# Patient Record
Sex: Female | Born: 1945 | Race: Black or African American | Hispanic: No | Smoking: Never smoker
Health system: Southern US, Community
[De-identification: ages and names within clinical notes are randomized; demographics above are authoritative.]

## PROBLEM LIST (undated history)

## (undated) DIAGNOSIS — E7800 Pure hypercholesterolemia, unspecified: Secondary | ICD-10-CM

## (undated) DIAGNOSIS — I10 Essential (primary) hypertension: Secondary | ICD-10-CM

## (undated) DIAGNOSIS — E119 Type 2 diabetes mellitus without complications: Secondary | ICD-10-CM

## (undated) HISTORY — PX: CARPAL TUNNEL RELEASE: SHX101

---

## 1997-11-19 ENCOUNTER — Encounter: Admission: RE | Admit: 1997-11-19 | Discharge: 1997-11-19 | Payer: Self-pay | Admitting: Family Medicine

## 1997-12-19 ENCOUNTER — Encounter: Admission: RE | Admit: 1997-12-19 | Discharge: 1997-12-19 | Payer: Self-pay | Admitting: Family Medicine

## 1998-04-16 ENCOUNTER — Encounter: Admission: RE | Admit: 1998-04-16 | Discharge: 1998-04-16 | Payer: Self-pay | Admitting: Family Medicine

## 1998-05-09 ENCOUNTER — Encounter: Admission: RE | Admit: 1998-05-09 | Discharge: 1998-05-09 | Payer: Self-pay | Admitting: Family Medicine

## 1998-06-04 ENCOUNTER — Encounter: Admission: RE | Admit: 1998-06-04 | Discharge: 1998-06-04 | Payer: Self-pay | Admitting: Family Medicine

## 1998-07-02 ENCOUNTER — Encounter: Admission: RE | Admit: 1998-07-02 | Discharge: 1998-07-02 | Payer: Self-pay | Admitting: Family Medicine

## 1998-07-30 ENCOUNTER — Encounter: Admission: RE | Admit: 1998-07-30 | Discharge: 1998-07-30 | Payer: Self-pay | Admitting: Family Medicine

## 1999-04-09 ENCOUNTER — Encounter: Admission: RE | Admit: 1999-04-09 | Discharge: 1999-04-09 | Payer: Self-pay | Admitting: Family Medicine

## 1999-05-15 ENCOUNTER — Encounter: Admission: RE | Admit: 1999-05-15 | Discharge: 1999-05-15 | Payer: Self-pay | Admitting: Family Medicine

## 1999-06-09 ENCOUNTER — Encounter: Payer: Self-pay | Admitting: Sports Medicine

## 1999-06-09 ENCOUNTER — Encounter: Admission: RE | Admit: 1999-06-09 | Discharge: 1999-06-09 | Payer: Self-pay | Admitting: Sports Medicine

## 1999-06-16 ENCOUNTER — Encounter: Admission: RE | Admit: 1999-06-16 | Discharge: 1999-06-16 | Payer: Self-pay | Admitting: Sports Medicine

## 1999-06-16 ENCOUNTER — Encounter: Payer: Self-pay | Admitting: Sports Medicine

## 1999-11-25 ENCOUNTER — Encounter: Admission: RE | Admit: 1999-11-25 | Discharge: 1999-11-25 | Payer: Self-pay | Admitting: Family Medicine

## 1999-12-09 ENCOUNTER — Encounter: Admission: RE | Admit: 1999-12-09 | Discharge: 1999-12-09 | Payer: Self-pay | Admitting: Family Medicine

## 1999-12-23 ENCOUNTER — Encounter: Admission: RE | Admit: 1999-12-23 | Discharge: 1999-12-23 | Payer: Self-pay | Admitting: Family Medicine

## 2000-01-14 ENCOUNTER — Encounter: Admission: RE | Admit: 2000-01-14 | Discharge: 2000-01-14 | Payer: Self-pay | Admitting: Family Medicine

## 2000-06-20 ENCOUNTER — Encounter: Admission: RE | Admit: 2000-06-20 | Discharge: 2000-06-20 | Payer: Self-pay | Admitting: *Deleted

## 2000-12-20 ENCOUNTER — Encounter: Admission: RE | Admit: 2000-12-20 | Discharge: 2000-12-20 | Payer: Self-pay | Admitting: Family Medicine

## 2001-01-26 ENCOUNTER — Encounter: Admission: RE | Admit: 2001-01-26 | Discharge: 2001-01-26 | Payer: Self-pay | Admitting: Family Medicine

## 2001-02-21 ENCOUNTER — Encounter: Admission: RE | Admit: 2001-02-21 | Discharge: 2001-02-21 | Payer: Self-pay | Admitting: Sports Medicine

## 2001-03-27 ENCOUNTER — Ambulatory Visit (HOSPITAL_COMMUNITY): Admission: RE | Admit: 2001-03-27 | Discharge: 2001-03-27 | Payer: Self-pay | Admitting: Gastroenterology

## 2001-03-28 ENCOUNTER — Encounter: Admission: RE | Admit: 2001-03-28 | Discharge: 2001-03-28 | Payer: Self-pay | Admitting: Family Medicine

## 2001-03-29 ENCOUNTER — Ambulatory Visit (HOSPITAL_COMMUNITY): Admission: RE | Admit: 2001-03-29 | Discharge: 2001-03-29 | Payer: Self-pay | Admitting: Family Medicine

## 2001-04-12 ENCOUNTER — Encounter: Admission: RE | Admit: 2001-04-12 | Discharge: 2001-04-12 | Payer: Self-pay | Admitting: Family Medicine

## 2001-04-20 ENCOUNTER — Encounter: Admission: RE | Admit: 2001-04-20 | Discharge: 2001-04-20 | Payer: Self-pay | Admitting: *Deleted

## 2001-04-28 ENCOUNTER — Encounter: Admission: RE | Admit: 2001-04-28 | Discharge: 2001-04-28 | Payer: Self-pay | Admitting: Family Medicine

## 2001-05-05 ENCOUNTER — Encounter: Admission: RE | Admit: 2001-05-05 | Discharge: 2001-05-05 | Payer: Self-pay | Admitting: Family Medicine

## 2001-06-06 ENCOUNTER — Encounter: Admission: RE | Admit: 2001-06-06 | Discharge: 2001-06-06 | Payer: Self-pay | Admitting: Sports Medicine

## 2001-06-28 ENCOUNTER — Encounter: Admission: RE | Admit: 2001-06-28 | Discharge: 2001-06-28 | Payer: Self-pay | Admitting: *Deleted

## 2001-10-05 ENCOUNTER — Encounter: Admission: RE | Admit: 2001-10-05 | Discharge: 2001-10-05 | Payer: Self-pay | Admitting: Family Medicine

## 2002-02-13 ENCOUNTER — Encounter (INDEPENDENT_AMBULATORY_CARE_PROVIDER_SITE_OTHER): Payer: Self-pay | Admitting: *Deleted

## 2002-02-13 LAB — CONVERTED CEMR LAB

## 2002-02-14 ENCOUNTER — Encounter: Admission: RE | Admit: 2002-02-14 | Discharge: 2002-02-14 | Payer: Self-pay | Admitting: Family Medicine

## 2002-07-13 ENCOUNTER — Encounter: Admission: RE | Admit: 2002-07-13 | Discharge: 2002-07-13 | Payer: Self-pay | Admitting: Family Medicine

## 2002-07-13 ENCOUNTER — Encounter: Payer: Self-pay | Admitting: Family Medicine

## 2003-01-28 ENCOUNTER — Encounter: Admission: RE | Admit: 2003-01-28 | Discharge: 2003-01-28 | Payer: Self-pay | Admitting: Family Medicine

## 2003-03-11 ENCOUNTER — Encounter: Admission: RE | Admit: 2003-03-11 | Discharge: 2003-03-11 | Payer: Self-pay | Admitting: Family Medicine

## 2003-07-30 ENCOUNTER — Encounter: Admission: RE | Admit: 2003-07-30 | Discharge: 2003-07-30 | Payer: Self-pay | Admitting: Sports Medicine

## 2003-08-26 ENCOUNTER — Encounter: Admission: RE | Admit: 2003-08-26 | Discharge: 2003-08-26 | Payer: Self-pay | Admitting: Sports Medicine

## 2004-02-10 ENCOUNTER — Encounter: Admission: RE | Admit: 2004-02-10 | Discharge: 2004-02-10 | Payer: Self-pay | Admitting: Sports Medicine

## 2004-02-10 ENCOUNTER — Ambulatory Visit (HOSPITAL_COMMUNITY): Admission: RE | Admit: 2004-02-10 | Discharge: 2004-02-10 | Payer: Self-pay | Admitting: Sports Medicine

## 2004-09-02 ENCOUNTER — Encounter: Admission: RE | Admit: 2004-09-02 | Discharge: 2004-09-02 | Payer: Self-pay | Admitting: Sports Medicine

## 2004-11-17 ENCOUNTER — Ambulatory Visit: Payer: Self-pay | Admitting: Family Medicine

## 2004-11-17 ENCOUNTER — Encounter: Admission: RE | Admit: 2004-11-17 | Discharge: 2004-11-17 | Payer: Self-pay | Admitting: Family Medicine

## 2004-11-30 ENCOUNTER — Ambulatory Visit: Payer: Self-pay | Admitting: Family Medicine

## 2004-12-17 ENCOUNTER — Ambulatory Visit: Payer: Self-pay | Admitting: Family Medicine

## 2005-04-26 ENCOUNTER — Ambulatory Visit: Payer: Self-pay | Admitting: Family Medicine

## 2005-05-17 ENCOUNTER — Ambulatory Visit: Payer: Self-pay | Admitting: Family Medicine

## 2005-05-24 ENCOUNTER — Ambulatory Visit: Payer: Self-pay | Admitting: Family Medicine

## 2005-12-14 ENCOUNTER — Encounter: Admission: RE | Admit: 2005-12-14 | Discharge: 2005-12-14 | Payer: Self-pay | Admitting: Internal Medicine

## 2005-12-20 ENCOUNTER — Other Ambulatory Visit: Admission: RE | Admit: 2005-12-20 | Discharge: 2005-12-20 | Payer: Self-pay | Admitting: Internal Medicine

## 2006-01-27 ENCOUNTER — Ambulatory Visit (HOSPITAL_BASED_OUTPATIENT_CLINIC_OR_DEPARTMENT_OTHER): Admission: RE | Admit: 2006-01-27 | Discharge: 2006-01-27 | Payer: Self-pay | Admitting: Orthopedic Surgery

## 2006-02-25 ENCOUNTER — Ambulatory Visit (HOSPITAL_BASED_OUTPATIENT_CLINIC_OR_DEPARTMENT_OTHER): Admission: RE | Admit: 2006-02-25 | Discharge: 2006-02-25 | Payer: Self-pay | Admitting: Orthopedic Surgery

## 2006-10-13 DIAGNOSIS — D649 Anemia, unspecified: Secondary | ICD-10-CM

## 2006-10-13 DIAGNOSIS — E669 Obesity, unspecified: Secondary | ICD-10-CM | POA: Insufficient documentation

## 2006-10-13 DIAGNOSIS — I10 Essential (primary) hypertension: Secondary | ICD-10-CM | POA: Insufficient documentation

## 2006-10-13 DIAGNOSIS — J309 Allergic rhinitis, unspecified: Secondary | ICD-10-CM | POA: Insufficient documentation

## 2006-10-14 ENCOUNTER — Encounter (INDEPENDENT_AMBULATORY_CARE_PROVIDER_SITE_OTHER): Payer: Self-pay | Admitting: *Deleted

## 2006-12-16 ENCOUNTER — Encounter: Admission: RE | Admit: 2006-12-16 | Discharge: 2006-12-16 | Payer: Self-pay | Admitting: Internal Medicine

## 2007-12-18 ENCOUNTER — Encounter: Admission: RE | Admit: 2007-12-18 | Discharge: 2007-12-18 | Payer: Self-pay | Admitting: Internal Medicine

## 2008-12-23 ENCOUNTER — Encounter: Admission: RE | Admit: 2008-12-23 | Discharge: 2008-12-23 | Payer: Self-pay | Admitting: Internal Medicine

## 2009-02-14 ENCOUNTER — Other Ambulatory Visit: Admission: RE | Admit: 2009-02-14 | Discharge: 2009-02-14 | Payer: Self-pay | Admitting: Internal Medicine

## 2010-01-01 ENCOUNTER — Encounter: Admission: RE | Admit: 2010-01-01 | Discharge: 2010-01-01 | Payer: Self-pay | Admitting: Internal Medicine

## 2010-07-03 IMAGING — MG MM SCREEN MAMMOGRAM BILATERAL
6 series · 6 of 6 positions shown · non-contrast
Comparison: none

DG SCREEN MAMMOGRAM BILATERAL
Bilateral CC and MLO view(s) were taken.

DIGITAL SCREENING MAMMOGRAM WITH CAD:
There are scattered fibroglandular densities.  No masses or malignant type calcifications are 
identified.  Compared with prior studies.

[R CC]
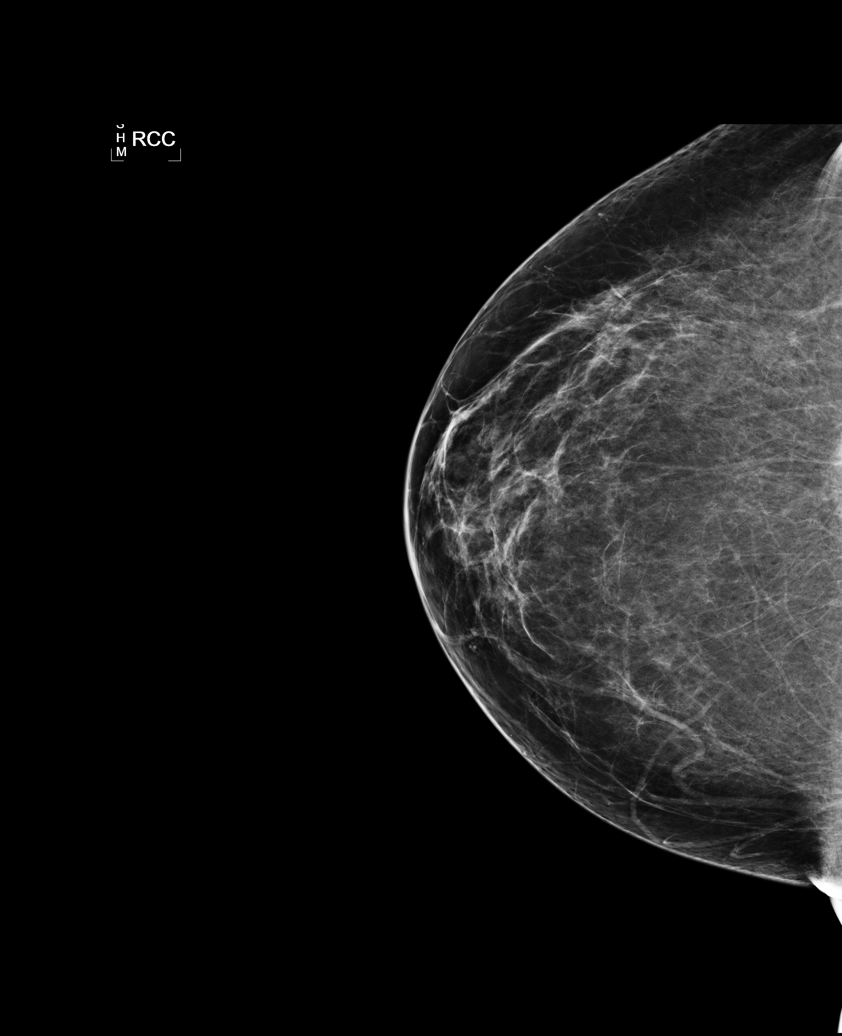

[L CC]
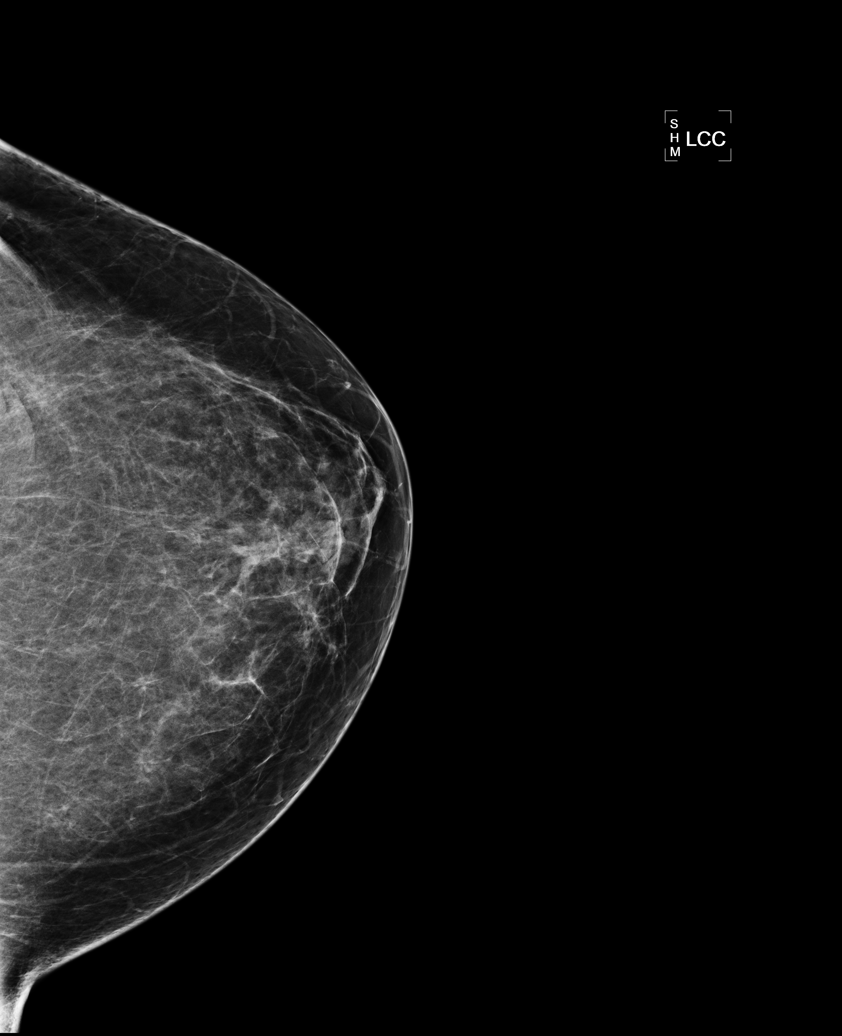

[L MLO (1 of 2)]
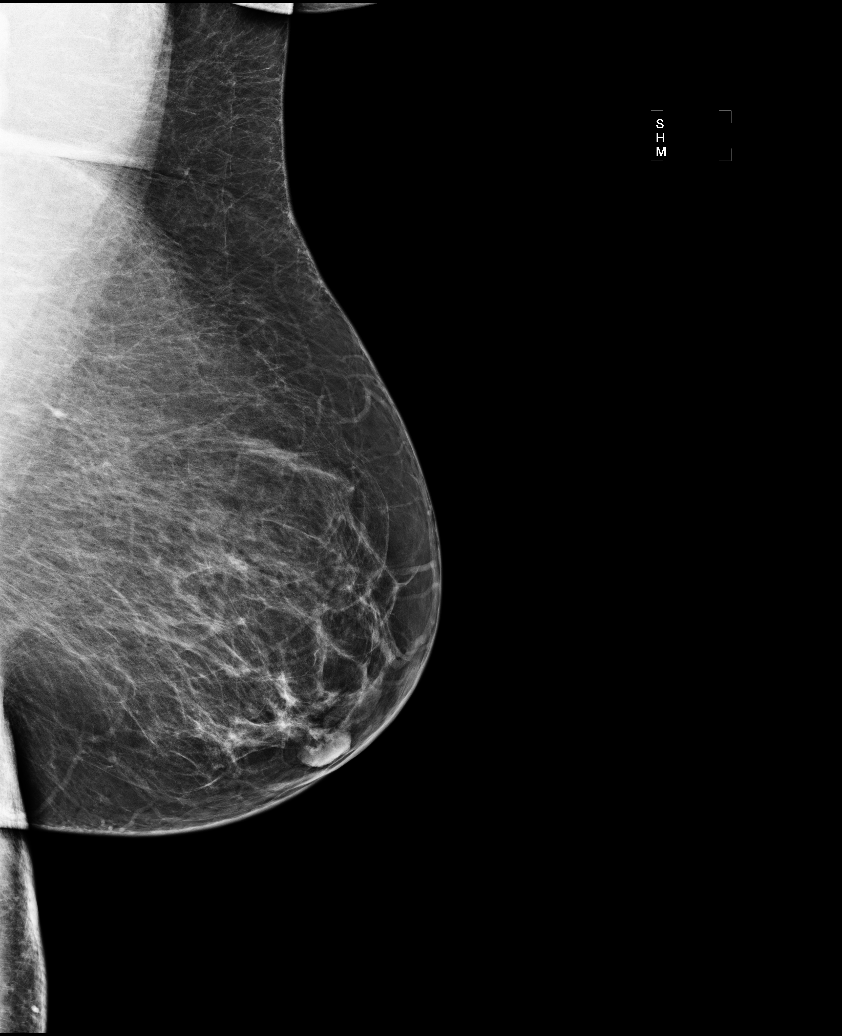

[R MLO (1 of 2)]
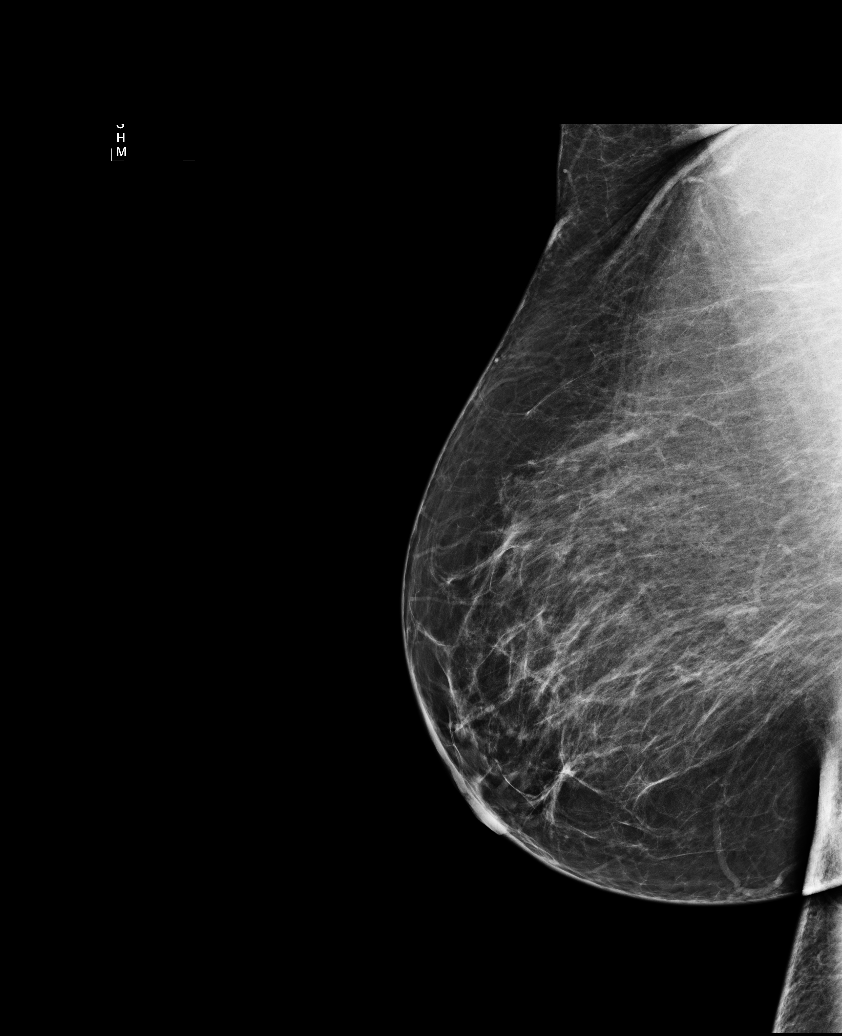

[R MLO (2 of 2)]
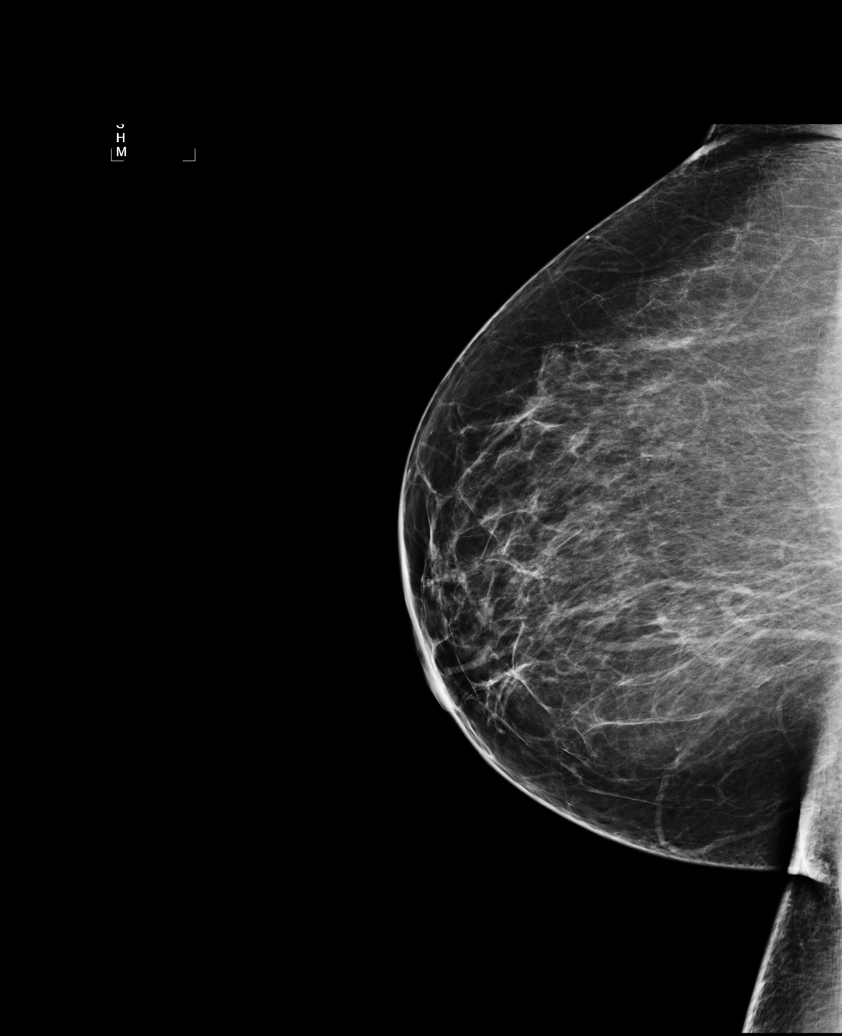

[L MLO (2 of 2)]
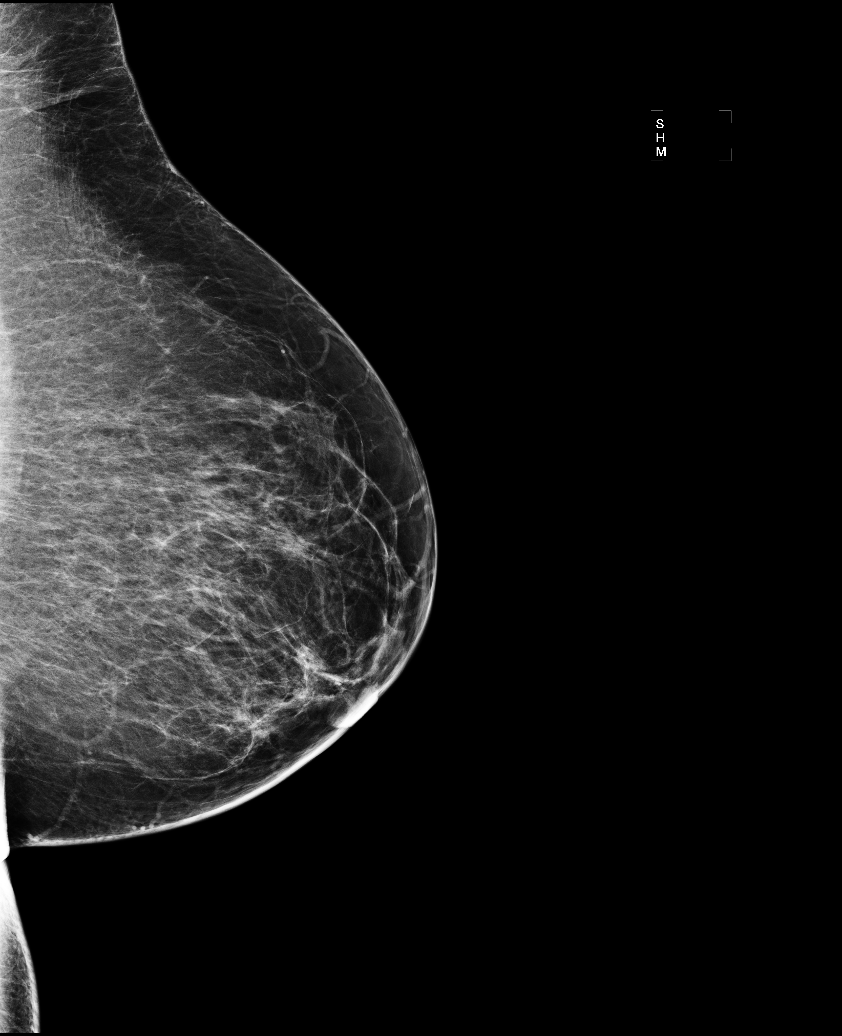

[6 of 6 positions shown; findings below may reference images not displayed]

IMPRESSION: No specific mammographic evidence of malignancy.  Next screening mammogram is recommended in one 
year.

A result letter of this screening mammogram will be mailed directly to the patient.

ASSESSMENT: Negative - BI-RADS 1

Screening mammogram in 1 year.
ANALYZED BY COMPUTER AIDED DETECTION. , THIS PROCEDURE WAS A DIGITAL MAMMOGRAM.

## 2010-09-06 ENCOUNTER — Encounter: Payer: Self-pay | Admitting: Internal Medicine

## 2010-12-07 ENCOUNTER — Other Ambulatory Visit: Payer: Self-pay | Admitting: Internal Medicine

## 2010-12-07 DIAGNOSIS — Z1231 Encounter for screening mammogram for malignant neoplasm of breast: Secondary | ICD-10-CM

## 2011-01-01 NOTE — Op Note (Signed)
[name], [name]                ACCOUNT NO.:  [id number]   MEDICAL RECORD NO.:  [id number]          PATIENT TYPE:  AMB   LOCATION:  DSC                          FACILITY:  [facility]   PHYSICIAN:  Katy Fitch. Sypher, M.D. 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   OPERATIONS:  Release of left transverse carpal ligament.   OPERATING SURGEON:  Katy Fitch. Sypher, M.D.   ASSISTANT:  Marveen Reeks. Dasnoit, P.A.-C.   ANESTHESIA:  General by LMA.   SUPERVISING ANESTHESIOLOGIST:  Janetta Hora. Gelene Mink, M.D.   INDICATIONS:  Sheena Parks is a 65 year old woman referred through the  courtesy of Dr. Donette Larry at [facility] for evaluation and management of  bilateral hand numbness.  Clinical examination revealed a pleasant 59-year-  old woman.  Inspection of her hands revealed loss of sensibility in the  median distribution left greater than right.  Electrodiagnostic studies  confirmed severe left carpal tunnel syndrome and moderate right carpal  tunnel syndrome.  We recommended proceeding with staged-release of her  transverse carpal ligament left followed by right.  After informed consent,  she is brought to the operating room at this time.   DESCRIPTION OF PROCEDURE:  Sheena Parks is brought to the operating room and  placed in supine position upon the operating table.  Following the induction  of general anesthesia by LMA technique.  The left arm was prepped with  Betadine soap solution, and sterilely draped.  Following exsanguination of  the left arm with an Esmarch bandage, an arterial tourniquet in the proximal  brachium is inflated to 220 mmHg.   The procedure commenced with a short incision in the line of the ring finger  in the palm.   Subcutaneous tissues were carefully divided via the palmar  fascia.  The fascia was split longitudinally to reveal the common sensory  branch of the median nerve.  These were followed back to the transverse  carpal ligament which was gently isolated from the median nerve.  The  ligament was released, with scissors, along its ulnar border extending into  the distal forearm.  This widely opened carpal canal.   No masses or other predicaments are noted.  Bleeding points along the margin  of the loose ligament were electrocauterized with bipolar current, followed  by repair of the skin with intradermal 3-0 Prolene suture.  A compressive  dressing was applied with a volar plaster splint to maintain the wrist in 5-  degrees of dorsiflexion.      Katy Fitch Sypher, M.D.  Electronically Signed     RVS/MEDQ  D:  01/27/2006  T:  01/27/2006  Job:  [id number]

## 2011-01-01 NOTE — Procedures (Signed)
[facility]  Patient:    Sheena Parks, Sheena Parks                       MRN: [id number] Proc. Date: 03/27/01 Adm. Date:  [id number] Attending:  Louie Bun CC:         Sibyl Parr. Darrick Penna, M.D.   Procedure Report  INCOMPLETE DICTATION  DESCRIPTION OF PROCEDURE:  Delivery by nasal cannula. She was sedated with 80 mg IV Demerol and 9 mg IV Versed. The Olympus video colonoscope was inserted into the rectum and advanced to the cecum, confirmed by transillumination at  McBurneys point. DD:  03/27/01 TD:  03/27/01 Job: [id number]

## 2011-01-01 NOTE — Op Note (Signed)
NAMEROMONA, MURDY                ACCOUNT NO.:  [id number]   MEDICAL RECORD NO.:  [id number]          PATIENT TYPE:  AMB   LOCATION:  DSC                          FACILITY:  [facility]   PHYSICIAN:  Katy Fitch. Sypher, M.D. DATE OF BIRTH:  08-11-1946   DATE OF PROCEDURE:  02/25/2006  DATE OF DISCHARGE:                                 OPERATIVE REPORT   PREOPERATIVE DIAGNOSIS:  Chronic median entrapment neuropathy, right wrist.   POSTOPERATIVE DIAGNOSIS:  Chronic median entrapment neuropathy, right wrist.   OPERATIONS:  Release of right transverse carpal ligament.   OPERATING SURGEON:  Katy Fitch. Sypher, M.D.   ASSISTANT:  Molly Maduro Dasnoit PA-C.   ANESTHESIA:  General by LMA, supervising anesthesiologist is Dr. Gypsy Balsam.   INDICATIONS:  Sheena Parks a 65 year old woman referred through the courtesy  of Georgann Housekeeper, MD, for evaluation and management of bilateral hand  numbness.  She is status post release of her left transverse carpal ligament  with good results.  She now presents for release of her right transverse  carpal ligament.  Preoperatively the potential risks and benefits of surgery  have been discussed.  Questions were invited and answered.   PROCEDURE:  Sheena Parks is brought to the operating room and placed in  supine position on the operating table.   Following the induction of general anesthesia by LMA technique, the right  arm was prepped with Betadine soap and solution and sterilely draped.  Following exsanguination of the right arm with an Esmarch bandage, an  arterial tourniquet on the proximal brachium was inflated to 240 mmHg.  The  procedure commenced with a short incision in the line of the ring finger in  the palm.  Subcutaneous tissues were carefully divided revealing the palmar  fascia.  This was split longitudinally to reveal the common sensory branch  of the median nerve.  These were followed up to the transverse carpal  ligament, which was carefully isolated  from the median nerve.  The ligament  was released along its ulnar border extending into the distal forearm.  Unfortunately, due to considerable amount of adipose tissue, visualization  of the volar forearm fascia was nearly impossible.  We were required to  extend the incision proximally another 15 mm to allow safe visualization of  the volar forearm fascia.   The fascia was released along the ulnar aspect of the wrist a distance of 4  cm above the distal wrist flexion crease.   This widely opened the carpal canal.  No mass or other predicaments were  noted.  Sheena Parks tolerated the procedure well.  Her wound was inspected  for bleeding points, which were cauterized with bipolar current, followed by  repair of the wound with intradermal 3-0 Prolene suture.   A compressive dressing was applied with a volar plaster splint maintaining  the wrist in 5 degrees of dorsiflexion.  There were no apparent  complications.      Katy Fitch Sypher, M.D.  Electronically Signed     RVS/MEDQ  D:  02/25/2006  T:  02/25/2006  Job:  [id number]

## 2011-01-01 NOTE — Procedures (Signed)
[facility]  Patient:    AMOUR, TRIGG Visit Number: [id number] MRN: [id number]          Service Type: END Location: ENDO Attending Physician:  Louie Bun Dictated by:   Everardo All Madilyn Fireman, M.D. Proc. Date: 03/27/01 Admit Date:  03/27/2001 Discharge Date: 03/27/2001   CC:         Royal Hawthorn B. Darrick Penna, M.D.   Procedure Report  REDICTATION  PROCEDURE:  Colonoscopy.  SURGEON:  John C. Madilyn Fireman, M.D.  INDICATIONS FOR PROCEDURE:  Heme positive stool.  DESCRIPTION OF PROCEDURE:  The patient was placed in the left lateral decubitus position and placed on the pulse monitor with continuous low flow oxygen delivered by nasal cannula.  She was sedated with 80 mg of IV Demerol and 9 mg of IV Versed.  The Olympus video colonoscope was inserted into the rectum and advanced to the cecum, confirmed by transillumination at McBurneys point, and visualization of the ileocecal valve and appendiceal orifice.  The prep was good.  The cecum, ascending, transverse, descending, and sigmoid colon all appeared normal with no masses, polyps, diverticula, or other mucosal abnormalities.  The rectum likewise appeared to be normal and retroflexed view of the anus did reveal some small internal hemorrhoids.  The colonoscope was then withdrawn and the patient returned to the recovery room in stable condition.  She tolerated the procedure well and there were no immediate complications.  IMPRESSION:  Internal hemorrhoids, otherwise normal colonoscopy. Dictated by:   Everardo All Madilyn Fireman, M.D. Attending Physician:  Louie Bun DD:  05/04/01 TD:  05/04/01 Job: [id number]

## 2011-01-04 ENCOUNTER — Ambulatory Visit
Admission: RE | Admit: 2011-01-04 | Discharge: 2011-01-04 | Disposition: A | Discharge status: Home / Self Care | Payer: PRIVATE HEALTH INSURANCE | Source: Ambulatory Visit | Attending: Internal Medicine | Admitting: Internal Medicine

## 2011-01-04 DIAGNOSIS — Z1231 Encounter for screening mammogram for malignant neoplasm of breast: Secondary | ICD-10-CM

## 2011-12-10 ENCOUNTER — Other Ambulatory Visit: Payer: Self-pay | Admitting: Internal Medicine

## 2011-12-10 DIAGNOSIS — Z1231 Encounter for screening mammogram for malignant neoplasm of breast: Secondary | ICD-10-CM

## 2012-01-05 ENCOUNTER — Ambulatory Visit
Admission: RE | Admit: 2012-01-05 | Discharge: 2012-01-05 | Disposition: A | Discharge status: Home / Self Care | Payer: Medicare Other | Source: Ambulatory Visit | Attending: Internal Medicine | Admitting: Internal Medicine

## 2012-01-05 DIAGNOSIS — Z1231 Encounter for screening mammogram for malignant neoplasm of breast: Secondary | ICD-10-CM

## 2012-12-06 ENCOUNTER — Other Ambulatory Visit: Payer: Self-pay

## 2012-12-06 DIAGNOSIS — Z1231 Encounter for screening mammogram for malignant neoplasm of breast: Secondary | ICD-10-CM

## 2013-01-11 ENCOUNTER — Ambulatory Visit
Admission: RE | Admit: 2013-01-11 | Discharge: 2013-01-11 | Disposition: A | Discharge status: Home / Self Care | Payer: Medicare Other | Source: Ambulatory Visit

## 2013-01-11 DIAGNOSIS — Z1231 Encounter for screening mammogram for malignant neoplasm of breast: Secondary | ICD-10-CM

## 2013-07-04 ENCOUNTER — Encounter (HOSPITAL_COMMUNITY): Payer: Self-pay

## 2013-07-05 ENCOUNTER — Other Ambulatory Visit: Payer: Self-pay | Admitting: Internal Medicine

## 2013-07-05 DIAGNOSIS — N6313 Unspecified lump in the right breast, lower outer quadrant: Secondary | ICD-10-CM

## 2013-07-16 ENCOUNTER — Ambulatory Visit
Admission: RE | Admit: 2013-07-16 | Discharge: 2013-07-16 | Disposition: A | Discharge status: Home / Self Care | Payer: Medicare Other | Source: Ambulatory Visit | Attending: Internal Medicine | Admitting: Internal Medicine

## 2013-07-16 DIAGNOSIS — N6313 Unspecified lump in the right breast, lower outer quadrant: Secondary | ICD-10-CM

## 2013-12-20 ENCOUNTER — Other Ambulatory Visit: Payer: Self-pay

## 2013-12-20 DIAGNOSIS — Z1231 Encounter for screening mammogram for malignant neoplasm of breast: Secondary | ICD-10-CM

## 2014-01-15 ENCOUNTER — Ambulatory Visit
Admission: RE | Admit: 2014-01-15 | Discharge: 2014-01-15 | Disposition: A | Discharge status: Home / Self Care | Payer: Medicare Other | Source: Ambulatory Visit

## 2014-01-15 ENCOUNTER — Encounter (INDEPENDENT_AMBULATORY_CARE_PROVIDER_SITE_OTHER): Payer: Self-pay

## 2014-01-15 DIAGNOSIS — Z1231 Encounter for screening mammogram for malignant neoplasm of breast: Secondary | ICD-10-CM

## 2014-12-16 ENCOUNTER — Other Ambulatory Visit: Payer: Self-pay

## 2014-12-16 DIAGNOSIS — Z1231 Encounter for screening mammogram for malignant neoplasm of breast: Secondary | ICD-10-CM

## 2015-01-20 ENCOUNTER — Ambulatory Visit
Admission: RE | Admit: 2015-01-20 | Discharge: 2015-01-20 | Disposition: A | Discharge status: Home / Self Care | Payer: PPO | Source: Ambulatory Visit

## 2015-01-20 DIAGNOSIS — Z1231 Encounter for screening mammogram for malignant neoplasm of breast: Secondary | ICD-10-CM

## 2015-10-30 DIAGNOSIS — M1990 Unspecified osteoarthritis, unspecified site: Secondary | ICD-10-CM | POA: Diagnosis not present

## 2015-10-30 DIAGNOSIS — I10 Essential (primary) hypertension: Secondary | ICD-10-CM | POA: Diagnosis not present

## 2015-10-30 DIAGNOSIS — Z7984 Long term (current) use of oral hypoglycemic drugs: Secondary | ICD-10-CM | POA: Diagnosis not present

## 2015-10-30 DIAGNOSIS — E119 Type 2 diabetes mellitus without complications: Secondary | ICD-10-CM | POA: Diagnosis not present

## 2015-10-30 DIAGNOSIS — Z6838 Body mass index (BMI) 38.0-38.9, adult: Secondary | ICD-10-CM | POA: Diagnosis not present

## 2015-10-30 DIAGNOSIS — E669 Obesity, unspecified: Secondary | ICD-10-CM | POA: Diagnosis not present

## 2015-12-02 DIAGNOSIS — Z029 Encounter for administrative examinations, unspecified: Secondary | ICD-10-CM | POA: Diagnosis not present

## 2016-01-27 ENCOUNTER — Other Ambulatory Visit: Payer: Self-pay | Admitting: Internal Medicine

## 2016-01-27 DIAGNOSIS — Z1231 Encounter for screening mammogram for malignant neoplasm of breast: Secondary | ICD-10-CM

## 2016-02-10 ENCOUNTER — Ambulatory Visit
Admission: RE | Admit: 2016-02-10 | Discharge: 2016-02-10 | Disposition: A | Discharge status: Home / Self Care | Payer: Commercial Managed Care - HMO | Source: Ambulatory Visit | Attending: Internal Medicine | Admitting: Internal Medicine

## 2016-02-10 DIAGNOSIS — Z1231 Encounter for screening mammogram for malignant neoplasm of breast: Secondary | ICD-10-CM

## 2016-05-19 DIAGNOSIS — M8580 Other specified disorders of bone density and structure, unspecified site: Secondary | ICD-10-CM | POA: Diagnosis not present

## 2016-05-19 DIAGNOSIS — E7800 Pure hypercholesterolemia, unspecified: Secondary | ICD-10-CM | POA: Diagnosis not present

## 2016-05-19 DIAGNOSIS — M1990 Unspecified osteoarthritis, unspecified site: Secondary | ICD-10-CM | POA: Diagnosis not present

## 2016-05-19 DIAGNOSIS — Z0000 Encounter for general adult medical examination without abnormal findings: Secondary | ICD-10-CM | POA: Diagnosis not present

## 2016-05-19 DIAGNOSIS — Z1389 Encounter for screening for other disorder: Secondary | ICD-10-CM | POA: Diagnosis not present

## 2016-05-19 DIAGNOSIS — Z1159 Encounter for screening for other viral diseases: Secondary | ICD-10-CM | POA: Diagnosis not present

## 2016-05-19 DIAGNOSIS — E119 Type 2 diabetes mellitus without complications: Secondary | ICD-10-CM | POA: Diagnosis not present

## 2016-05-19 DIAGNOSIS — I10 Essential (primary) hypertension: Secondary | ICD-10-CM | POA: Diagnosis not present

## 2016-05-19 DIAGNOSIS — Z23 Encounter for immunization: Secondary | ICD-10-CM | POA: Diagnosis not present

## 2016-05-19 DIAGNOSIS — Z7984 Long term (current) use of oral hypoglycemic drugs: Secondary | ICD-10-CM | POA: Diagnosis not present

## 2016-12-03 DIAGNOSIS — I10 Essential (primary) hypertension: Secondary | ICD-10-CM | POA: Diagnosis not present

## 2016-12-03 DIAGNOSIS — M810 Age-related osteoporosis without current pathological fracture: Secondary | ICD-10-CM | POA: Diagnosis not present

## 2016-12-03 DIAGNOSIS — Z6838 Body mass index (BMI) 38.0-38.9, adult: Secondary | ICD-10-CM | POA: Diagnosis not present

## 2016-12-03 DIAGNOSIS — E119 Type 2 diabetes mellitus without complications: Secondary | ICD-10-CM | POA: Diagnosis not present

## 2016-12-03 DIAGNOSIS — M8580 Other specified disorders of bone density and structure, unspecified site: Secondary | ICD-10-CM | POA: Diagnosis not present

## 2016-12-03 DIAGNOSIS — Z7984 Long term (current) use of oral hypoglycemic drugs: Secondary | ICD-10-CM | POA: Diagnosis not present

## 2016-12-03 DIAGNOSIS — E669 Obesity, unspecified: Secondary | ICD-10-CM | POA: Diagnosis not present

## 2016-12-31 ENCOUNTER — Other Ambulatory Visit: Payer: Self-pay | Admitting: Internal Medicine

## 2016-12-31 DIAGNOSIS — Z1231 Encounter for screening mammogram for malignant neoplasm of breast: Secondary | ICD-10-CM

## 2017-02-10 ENCOUNTER — Ambulatory Visit
Admission: RE | Admit: 2017-02-10 | Discharge: 2017-02-10 | Disposition: A | Discharge status: Home / Self Care | Payer: Medicare HMO | Source: Ambulatory Visit | Attending: Internal Medicine | Admitting: Internal Medicine

## 2017-02-10 DIAGNOSIS — Z1231 Encounter for screening mammogram for malignant neoplasm of breast: Secondary | ICD-10-CM | POA: Diagnosis not present

## 2017-03-23 DIAGNOSIS — H5213 Myopia, bilateral: Secondary | ICD-10-CM | POA: Diagnosis not present

## 2017-03-23 DIAGNOSIS — H524 Presbyopia: Secondary | ICD-10-CM | POA: Diagnosis not present

## 2017-03-23 DIAGNOSIS — H52223 Regular astigmatism, bilateral: Secondary | ICD-10-CM | POA: Diagnosis not present

## 2017-03-23 DIAGNOSIS — H25813 Combined forms of age-related cataract, bilateral: Secondary | ICD-10-CM | POA: Diagnosis not present

## 2017-06-07 DIAGNOSIS — Z7189 Other specified counseling: Secondary | ICD-10-CM | POA: Diagnosis not present

## 2017-06-07 DIAGNOSIS — Z0000 Encounter for general adult medical examination without abnormal findings: Secondary | ICD-10-CM | POA: Diagnosis not present

## 2017-06-07 DIAGNOSIS — M1990 Unspecified osteoarthritis, unspecified site: Secondary | ICD-10-CM | POA: Diagnosis not present

## 2017-06-07 DIAGNOSIS — I10 Essential (primary) hypertension: Secondary | ICD-10-CM | POA: Diagnosis not present

## 2017-06-07 DIAGNOSIS — E1165 Type 2 diabetes mellitus with hyperglycemia: Secondary | ICD-10-CM | POA: Diagnosis not present

## 2017-06-07 DIAGNOSIS — Z7984 Long term (current) use of oral hypoglycemic drugs: Secondary | ICD-10-CM | POA: Diagnosis not present

## 2017-06-07 DIAGNOSIS — Z1389 Encounter for screening for other disorder: Secondary | ICD-10-CM | POA: Diagnosis not present

## 2017-06-07 DIAGNOSIS — M8580 Other specified disorders of bone density and structure, unspecified site: Secondary | ICD-10-CM | POA: Diagnosis not present

## 2017-06-07 DIAGNOSIS — Z6837 Body mass index (BMI) 37.0-37.9, adult: Secondary | ICD-10-CM | POA: Diagnosis not present

## 2017-08-01 DIAGNOSIS — M8588 Other specified disorders of bone density and structure, other site: Secondary | ICD-10-CM | POA: Diagnosis not present

## 2017-12-15 DIAGNOSIS — E1169 Type 2 diabetes mellitus with other specified complication: Secondary | ICD-10-CM | POA: Diagnosis not present

## 2017-12-15 DIAGNOSIS — Z6836 Body mass index (BMI) 36.0-36.9, adult: Secondary | ICD-10-CM | POA: Diagnosis not present

## 2017-12-15 DIAGNOSIS — E7800 Pure hypercholesterolemia, unspecified: Secondary | ICD-10-CM | POA: Diagnosis not present

## 2017-12-15 DIAGNOSIS — I10 Essential (primary) hypertension: Secondary | ICD-10-CM | POA: Diagnosis not present

## 2018-02-01 ENCOUNTER — Other Ambulatory Visit: Payer: Self-pay | Admitting: Internal Medicine

## 2018-02-01 DIAGNOSIS — Z1231 Encounter for screening mammogram for malignant neoplasm of breast: Secondary | ICD-10-CM

## 2018-02-23 ENCOUNTER — Ambulatory Visit
Admission: RE | Admit: 2018-02-23 | Discharge: 2018-02-23 | Disposition: A | Discharge status: Home / Self Care | Payer: Medicare HMO | Source: Ambulatory Visit | Attending: Internal Medicine | Admitting: Internal Medicine

## 2018-02-23 DIAGNOSIS — Z1231 Encounter for screening mammogram for malignant neoplasm of breast: Secondary | ICD-10-CM | POA: Diagnosis not present

## 2018-05-22 DIAGNOSIS — H52223 Regular astigmatism, bilateral: Secondary | ICD-10-CM | POA: Diagnosis not present

## 2018-05-22 DIAGNOSIS — H5213 Myopia, bilateral: Secondary | ICD-10-CM | POA: Diagnosis not present

## 2018-05-22 DIAGNOSIS — H524 Presbyopia: Secondary | ICD-10-CM | POA: Diagnosis not present

## 2018-07-07 DIAGNOSIS — Z0000 Encounter for general adult medical examination without abnormal findings: Secondary | ICD-10-CM | POA: Diagnosis not present

## 2018-07-07 DIAGNOSIS — Z1389 Encounter for screening for other disorder: Secondary | ICD-10-CM | POA: Diagnosis not present

## 2018-07-10 DIAGNOSIS — I10 Essential (primary) hypertension: Secondary | ICD-10-CM | POA: Diagnosis not present

## 2018-07-10 DIAGNOSIS — E7800 Pure hypercholesterolemia, unspecified: Secondary | ICD-10-CM | POA: Diagnosis not present

## 2018-07-10 DIAGNOSIS — E1169 Type 2 diabetes mellitus with other specified complication: Secondary | ICD-10-CM | POA: Diagnosis not present

## 2018-07-10 DIAGNOSIS — Z1211 Encounter for screening for malignant neoplasm of colon: Secondary | ICD-10-CM | POA: Diagnosis not present

## 2018-07-10 DIAGNOSIS — M8580 Other specified disorders of bone density and structure, unspecified site: Secondary | ICD-10-CM | POA: Diagnosis not present

## 2018-07-10 DIAGNOSIS — Z7984 Long term (current) use of oral hypoglycemic drugs: Secondary | ICD-10-CM | POA: Diagnosis not present

## 2018-07-10 DIAGNOSIS — Z6836 Body mass index (BMI) 36.0-36.9, adult: Secondary | ICD-10-CM | POA: Diagnosis not present

## 2018-07-10 DIAGNOSIS — M1990 Unspecified osteoarthritis, unspecified site: Secondary | ICD-10-CM | POA: Diagnosis not present

## 2018-08-10 DIAGNOSIS — Z1211 Encounter for screening for malignant neoplasm of colon: Secondary | ICD-10-CM | POA: Diagnosis not present

## 2019-01-09 DIAGNOSIS — E7800 Pure hypercholesterolemia, unspecified: Secondary | ICD-10-CM | POA: Diagnosis not present

## 2019-01-09 DIAGNOSIS — E1169 Type 2 diabetes mellitus with other specified complication: Secondary | ICD-10-CM | POA: Diagnosis not present

## 2019-01-09 DIAGNOSIS — I10 Essential (primary) hypertension: Secondary | ICD-10-CM | POA: Diagnosis not present

## 2019-01-09 DIAGNOSIS — Z7984 Long term (current) use of oral hypoglycemic drugs: Secondary | ICD-10-CM | POA: Diagnosis not present

## 2019-01-09 DIAGNOSIS — E119 Type 2 diabetes mellitus without complications: Secondary | ICD-10-CM | POA: Diagnosis not present

## 2019-01-23 DIAGNOSIS — E1169 Type 2 diabetes mellitus with other specified complication: Secondary | ICD-10-CM | POA: Diagnosis not present

## 2019-01-23 DIAGNOSIS — Z7984 Long term (current) use of oral hypoglycemic drugs: Secondary | ICD-10-CM | POA: Diagnosis not present

## 2019-01-25 ENCOUNTER — Other Ambulatory Visit: Payer: Self-pay | Admitting: Internal Medicine

## 2019-01-25 DIAGNOSIS — Z1231 Encounter for screening mammogram for malignant neoplasm of breast: Secondary | ICD-10-CM

## 2019-03-13 ENCOUNTER — Other Ambulatory Visit: Payer: Self-pay

## 2019-03-13 ENCOUNTER — Ambulatory Visit
Admission: RE | Admit: 2019-03-13 | Discharge: 2019-03-13 | Disposition: A | Discharge status: Home / Self Care | Payer: Medicare HMO | Source: Ambulatory Visit | Attending: Internal Medicine | Admitting: Internal Medicine

## 2019-03-13 DIAGNOSIS — Z1231 Encounter for screening mammogram for malignant neoplasm of breast: Secondary | ICD-10-CM

## 2019-05-21 DIAGNOSIS — Z20828 Contact with and (suspected) exposure to other viral communicable diseases: Secondary | ICD-10-CM | POA: Diagnosis not present

## 2019-05-29 DIAGNOSIS — H40003 Preglaucoma, unspecified, bilateral: Secondary | ICD-10-CM | POA: Diagnosis not present

## 2019-05-29 DIAGNOSIS — E119 Type 2 diabetes mellitus without complications: Secondary | ICD-10-CM | POA: Diagnosis not present

## 2019-05-29 DIAGNOSIS — H25813 Combined forms of age-related cataract, bilateral: Secondary | ICD-10-CM | POA: Diagnosis not present

## 2019-06-19 DIAGNOSIS — H25811 Combined forms of age-related cataract, right eye: Secondary | ICD-10-CM | POA: Diagnosis not present

## 2019-06-19 DIAGNOSIS — H25813 Combined forms of age-related cataract, bilateral: Secondary | ICD-10-CM | POA: Diagnosis not present

## 2019-06-19 DIAGNOSIS — Z01818 Encounter for other preprocedural examination: Secondary | ICD-10-CM | POA: Diagnosis not present

## 2019-06-19 DIAGNOSIS — E119 Type 2 diabetes mellitus without complications: Secondary | ICD-10-CM | POA: Diagnosis not present

## 2019-06-19 DIAGNOSIS — H25812 Combined forms of age-related cataract, left eye: Secondary | ICD-10-CM | POA: Diagnosis not present

## 2019-07-06 DIAGNOSIS — H2512 Age-related nuclear cataract, left eye: Secondary | ICD-10-CM | POA: Diagnosis not present

## 2019-07-06 DIAGNOSIS — H25812 Combined forms of age-related cataract, left eye: Secondary | ICD-10-CM | POA: Diagnosis not present

## 2019-07-20 DIAGNOSIS — H25811 Combined forms of age-related cataract, right eye: Secondary | ICD-10-CM | POA: Diagnosis not present

## 2019-07-20 DIAGNOSIS — H2511 Age-related nuclear cataract, right eye: Secondary | ICD-10-CM | POA: Diagnosis not present

## 2019-07-24 DIAGNOSIS — E1169 Type 2 diabetes mellitus with other specified complication: Secondary | ICD-10-CM | POA: Diagnosis not present

## 2019-07-24 DIAGNOSIS — E7800 Pure hypercholesterolemia, unspecified: Secondary | ICD-10-CM | POA: Diagnosis not present

## 2019-07-24 DIAGNOSIS — Z0000 Encounter for general adult medical examination without abnormal findings: Secondary | ICD-10-CM | POA: Diagnosis not present

## 2019-07-24 DIAGNOSIS — M810 Age-related osteoporosis without current pathological fracture: Secondary | ICD-10-CM | POA: Diagnosis not present

## 2019-07-24 DIAGNOSIS — Z6836 Body mass index (BMI) 36.0-36.9, adult: Secondary | ICD-10-CM | POA: Diagnosis not present

## 2019-07-24 DIAGNOSIS — Z1389 Encounter for screening for other disorder: Secondary | ICD-10-CM | POA: Diagnosis not present

## 2019-07-24 DIAGNOSIS — Z1211 Encounter for screening for malignant neoplasm of colon: Secondary | ICD-10-CM | POA: Diagnosis not present

## 2019-07-24 DIAGNOSIS — I10 Essential (primary) hypertension: Secondary | ICD-10-CM | POA: Diagnosis not present

## 2019-07-24 DIAGNOSIS — M1990 Unspecified osteoarthritis, unspecified site: Secondary | ICD-10-CM | POA: Diagnosis not present

## 2019-08-02 DIAGNOSIS — Z1211 Encounter for screening for malignant neoplasm of colon: Secondary | ICD-10-CM | POA: Diagnosis not present

## 2020-01-29 DIAGNOSIS — M8580 Other specified disorders of bone density and structure, unspecified site: Secondary | ICD-10-CM | POA: Diagnosis not present

## 2020-01-29 DIAGNOSIS — E7800 Pure hypercholesterolemia, unspecified: Secondary | ICD-10-CM | POA: Diagnosis not present

## 2020-01-29 DIAGNOSIS — Z23 Encounter for immunization: Secondary | ICD-10-CM | POA: Diagnosis not present

## 2020-01-29 DIAGNOSIS — E1169 Type 2 diabetes mellitus with other specified complication: Secondary | ICD-10-CM | POA: Diagnosis not present

## 2020-01-29 DIAGNOSIS — Z6836 Body mass index (BMI) 36.0-36.9, adult: Secondary | ICD-10-CM | POA: Diagnosis not present

## 2020-01-29 DIAGNOSIS — I10 Essential (primary) hypertension: Secondary | ICD-10-CM | POA: Diagnosis not present

## 2020-01-31 ENCOUNTER — Other Ambulatory Visit: Payer: Self-pay | Admitting: Internal Medicine

## 2020-01-31 DIAGNOSIS — M8580 Other specified disorders of bone density and structure, unspecified site: Secondary | ICD-10-CM

## 2020-02-04 DIAGNOSIS — Z0100 Encounter for examination of eyes and vision without abnormal findings: Secondary | ICD-10-CM | POA: Diagnosis not present

## 2020-02-07 ENCOUNTER — Other Ambulatory Visit: Payer: Self-pay | Admitting: Internal Medicine

## 2020-02-07 DIAGNOSIS — Z1231 Encounter for screening mammogram for malignant neoplasm of breast: Secondary | ICD-10-CM

## 2020-03-25 ENCOUNTER — Other Ambulatory Visit: Payer: Self-pay

## 2020-03-25 ENCOUNTER — Ambulatory Visit
Admission: RE | Admit: 2020-03-25 | Discharge: 2020-03-25 | Disposition: A | Discharge status: Home / Self Care | Payer: Medicare HMO | Source: Ambulatory Visit | Attending: Internal Medicine | Admitting: Internal Medicine

## 2020-03-25 DIAGNOSIS — M8580 Other specified disorders of bone density and structure, unspecified site: Secondary | ICD-10-CM

## 2020-03-25 DIAGNOSIS — Z1231 Encounter for screening mammogram for malignant neoplasm of breast: Secondary | ICD-10-CM

## 2020-03-25 DIAGNOSIS — Z780 Asymptomatic menopausal state: Secondary | ICD-10-CM | POA: Diagnosis not present

## 2020-03-25 DIAGNOSIS — M85852 Other specified disorders of bone density and structure, left thigh: Secondary | ICD-10-CM | POA: Diagnosis not present

## 2020-07-30 DIAGNOSIS — Z7984 Long term (current) use of oral hypoglycemic drugs: Secondary | ICD-10-CM | POA: Diagnosis not present

## 2020-07-30 DIAGNOSIS — Z23 Encounter for immunization: Secondary | ICD-10-CM | POA: Diagnosis not present

## 2020-07-30 DIAGNOSIS — Z0000 Encounter for general adult medical examination without abnormal findings: Secondary | ICD-10-CM | POA: Diagnosis not present

## 2020-07-30 DIAGNOSIS — Z1389 Encounter for screening for other disorder: Secondary | ICD-10-CM | POA: Diagnosis not present

## 2020-07-30 DIAGNOSIS — E1169 Type 2 diabetes mellitus with other specified complication: Secondary | ICD-10-CM | POA: Diagnosis not present

## 2020-07-30 DIAGNOSIS — E7800 Pure hypercholesterolemia, unspecified: Secondary | ICD-10-CM | POA: Diagnosis not present

## 2020-07-30 DIAGNOSIS — I10 Essential (primary) hypertension: Secondary | ICD-10-CM | POA: Diagnosis not present

## 2020-07-30 DIAGNOSIS — M8580 Other specified disorders of bone density and structure, unspecified site: Secondary | ICD-10-CM | POA: Diagnosis not present

## 2020-09-02 DIAGNOSIS — E119 Type 2 diabetes mellitus without complications: Secondary | ICD-10-CM | POA: Diagnosis not present

## 2020-09-02 DIAGNOSIS — H40013 Open angle with borderline findings, low risk, bilateral: Secondary | ICD-10-CM | POA: Diagnosis not present

## 2020-09-02 DIAGNOSIS — Z961 Presence of intraocular lens: Secondary | ICD-10-CM | POA: Diagnosis not present

## 2020-09-30 DIAGNOSIS — H40013 Open angle with borderline findings, low risk, bilateral: Secondary | ICD-10-CM | POA: Diagnosis not present

## 2021-02-17 ENCOUNTER — Other Ambulatory Visit: Payer: Self-pay | Admitting: Internal Medicine

## 2021-02-17 DIAGNOSIS — Z1231 Encounter for screening mammogram for malignant neoplasm of breast: Secondary | ICD-10-CM

## 2021-02-19 DIAGNOSIS — M1990 Unspecified osteoarthritis, unspecified site: Secondary | ICD-10-CM | POA: Diagnosis not present

## 2021-02-19 DIAGNOSIS — E1169 Type 2 diabetes mellitus with other specified complication: Secondary | ICD-10-CM | POA: Diagnosis not present

## 2021-02-19 DIAGNOSIS — I10 Essential (primary) hypertension: Secondary | ICD-10-CM | POA: Diagnosis not present

## 2021-02-19 DIAGNOSIS — Z7984 Long term (current) use of oral hypoglycemic drugs: Secondary | ICD-10-CM | POA: Diagnosis not present

## 2021-02-19 DIAGNOSIS — E7800 Pure hypercholesterolemia, unspecified: Secondary | ICD-10-CM | POA: Diagnosis not present

## 2021-02-19 DIAGNOSIS — M8580 Other specified disorders of bone density and structure, unspecified site: Secondary | ICD-10-CM | POA: Diagnosis not present

## 2021-02-19 DIAGNOSIS — G5600 Carpal tunnel syndrome, unspecified upper limb: Secondary | ICD-10-CM | POA: Diagnosis not present

## 2021-04-10 ENCOUNTER — Ambulatory Visit
Admission: RE | Admit: 2021-04-10 | Discharge: 2021-04-10 | Disposition: A | Discharge status: Home / Self Care | Payer: Medicare HMO | Source: Ambulatory Visit | Attending: Internal Medicine | Admitting: Internal Medicine

## 2021-04-10 ENCOUNTER — Other Ambulatory Visit: Payer: Self-pay

## 2021-04-10 DIAGNOSIS — Z1231 Encounter for screening mammogram for malignant neoplasm of breast: Secondary | ICD-10-CM

## 2021-06-30 DIAGNOSIS — G5600 Carpal tunnel syndrome, unspecified upper limb: Secondary | ICD-10-CM | POA: Diagnosis not present

## 2021-06-30 DIAGNOSIS — R202 Paresthesia of skin: Secondary | ICD-10-CM | POA: Diagnosis not present

## 2021-07-16 DIAGNOSIS — M79641 Pain in right hand: Secondary | ICD-10-CM | POA: Diagnosis not present

## 2021-07-16 DIAGNOSIS — M79642 Pain in left hand: Secondary | ICD-10-CM | POA: Diagnosis not present

## 2021-08-05 DIAGNOSIS — G5611 Other lesions of median nerve, right upper limb: Secondary | ICD-10-CM | POA: Diagnosis not present

## 2021-08-06 DIAGNOSIS — G5601 Carpal tunnel syndrome, right upper limb: Secondary | ICD-10-CM | POA: Diagnosis not present

## 2021-08-20 DIAGNOSIS — Z7984 Long term (current) use of oral hypoglycemic drugs: Secondary | ICD-10-CM | POA: Diagnosis not present

## 2021-08-20 DIAGNOSIS — I10 Essential (primary) hypertension: Secondary | ICD-10-CM | POA: Diagnosis not present

## 2021-08-20 DIAGNOSIS — Z0000 Encounter for general adult medical examination without abnormal findings: Secondary | ICD-10-CM | POA: Diagnosis not present

## 2021-08-20 DIAGNOSIS — E1169 Type 2 diabetes mellitus with other specified complication: Secondary | ICD-10-CM | POA: Diagnosis not present

## 2021-08-20 DIAGNOSIS — E7800 Pure hypercholesterolemia, unspecified: Secondary | ICD-10-CM | POA: Diagnosis not present

## 2021-08-20 DIAGNOSIS — M8580 Other specified disorders of bone density and structure, unspecified site: Secondary | ICD-10-CM | POA: Diagnosis not present

## 2021-08-20 DIAGNOSIS — Z1211 Encounter for screening for malignant neoplasm of colon: Secondary | ICD-10-CM | POA: Diagnosis not present

## 2021-08-20 DIAGNOSIS — Z1389 Encounter for screening for other disorder: Secondary | ICD-10-CM | POA: Diagnosis not present

## 2021-09-01 DIAGNOSIS — Z1211 Encounter for screening for malignant neoplasm of colon: Secondary | ICD-10-CM | POA: Diagnosis not present

## 2021-09-09 DIAGNOSIS — G5601 Carpal tunnel syndrome, right upper limb: Secondary | ICD-10-CM | POA: Diagnosis not present

## 2021-10-02 DIAGNOSIS — H35033 Hypertensive retinopathy, bilateral: Secondary | ICD-10-CM | POA: Diagnosis not present

## 2021-10-02 DIAGNOSIS — H40013 Open angle with borderline findings, low risk, bilateral: Secondary | ICD-10-CM | POA: Diagnosis not present

## 2021-10-02 DIAGNOSIS — E119 Type 2 diabetes mellitus without complications: Secondary | ICD-10-CM | POA: Diagnosis not present

## 2021-10-02 DIAGNOSIS — I10 Essential (primary) hypertension: Secondary | ICD-10-CM | POA: Diagnosis not present

## 2021-10-10 DIAGNOSIS — D171 Benign lipomatous neoplasm of skin and subcutaneous tissue of trunk: Secondary | ICD-10-CM | POA: Diagnosis not present

## 2021-10-12 DIAGNOSIS — D171 Benign lipomatous neoplasm of skin and subcutaneous tissue of trunk: Secondary | ICD-10-CM | POA: Diagnosis not present

## 2021-10-30 DIAGNOSIS — H52223 Regular astigmatism, bilateral: Secondary | ICD-10-CM | POA: Diagnosis not present

## 2021-10-30 DIAGNOSIS — Z961 Presence of intraocular lens: Secondary | ICD-10-CM | POA: Diagnosis not present

## 2021-10-30 DIAGNOSIS — H524 Presbyopia: Secondary | ICD-10-CM | POA: Diagnosis not present

## 2021-11-25 DIAGNOSIS — D171 Benign lipomatous neoplasm of skin and subcutaneous tissue of trunk: Secondary | ICD-10-CM | POA: Diagnosis not present

## 2021-12-17 ENCOUNTER — Encounter (HOSPITAL_BASED_OUTPATIENT_CLINIC_OR_DEPARTMENT_OTHER): Payer: Self-pay | Admitting: Surgery

## 2021-12-17 ENCOUNTER — Other Ambulatory Visit: Payer: Self-pay

## 2021-12-17 ENCOUNTER — Ambulatory Visit: Payer: Self-pay | Admitting: Surgery

## 2021-12-17 NOTE — Progress Notes (Signed)
Spoke w/ via phone for pre-op interview: patient ?Lab needs dos: EKG, I-Stat ?Lab results: NA ?COVID test: patient states asymptomatic no test needed. ?Arrive at 0800 12/21/21 ?NPO after MN except clear liquids.Clear liquids from MN until 0700 ?Med rec completed. ?Medications to take morning of surgery: amlodipine and Allegra D ?Diabetic medication: NA ?Patient instructed no nail polish to be worn day of surgery. ?Patient instructed to bring photo id and insurance card day of surgery.  ?Patient aware to have Driver (ride ) / caregiver for 24 hours after surgery. (Son to drive) ?Patient Special Instructions: Hold vitamins prior to sx ?Pre-Op special Istructions: NA ?Patient verbalized understanding of instructions that were given at this phone interview. ?Patient denies shortness of breath, chest pain, fever, cough at this phone interview.  ?

## 2021-12-18 NOTE — Progress Notes (Addendum)
Patient called and left a message as to what medications she should take the morning of surgery. I returned her call and left a message with instructions to take all medications as normal through the day before surgery, unless otherwise instructed by her surgeon.. On the morning of surgery she should only take Amlodipine, Zocor and Allegra D prn.  Also, no nail polish the day of surgery. At the very least she needs to remove polish off of her two index fingers. ?

## 2021-12-21 ENCOUNTER — Ambulatory Visit (HOSPITAL_BASED_OUTPATIENT_CLINIC_OR_DEPARTMENT_OTHER): Payer: Medicare HMO | Admitting: Certified Registered Nurse Anesthetist

## 2021-12-21 ENCOUNTER — Other Ambulatory Visit: Payer: Self-pay

## 2021-12-21 ENCOUNTER — Encounter (HOSPITAL_BASED_OUTPATIENT_CLINIC_OR_DEPARTMENT_OTHER)
Admission: RE | Disposition: A | Discharge status: Home / Self Care | Payer: Self-pay | Source: Home / Self Care | Attending: Surgery

## 2021-12-21 ENCOUNTER — Ambulatory Visit (HOSPITAL_BASED_OUTPATIENT_CLINIC_OR_DEPARTMENT_OTHER)
Admission: RE | Admit: 2021-12-21 | Discharge: 2021-12-21 | Disposition: A | Discharge status: Home / Self Care | Payer: Medicare HMO | Attending: Surgery | Admitting: Surgery

## 2021-12-21 ENCOUNTER — Encounter (HOSPITAL_BASED_OUTPATIENT_CLINIC_OR_DEPARTMENT_OTHER): Payer: Self-pay | Admitting: Surgery

## 2021-12-21 DIAGNOSIS — D171 Benign lipomatous neoplasm of skin and subcutaneous tissue of trunk: Secondary | ICD-10-CM

## 2021-12-21 DIAGNOSIS — Z79899 Other long term (current) drug therapy: Secondary | ICD-10-CM | POA: Insufficient documentation

## 2021-12-21 DIAGNOSIS — Z7984 Long term (current) use of oral hypoglycemic drugs: Secondary | ICD-10-CM | POA: Insufficient documentation

## 2021-12-21 DIAGNOSIS — E119 Type 2 diabetes mellitus without complications: Secondary | ICD-10-CM | POA: Diagnosis not present

## 2021-12-21 DIAGNOSIS — I10 Essential (primary) hypertension: Secondary | ICD-10-CM | POA: Diagnosis not present

## 2021-12-21 HISTORY — DX: Pure hypercholesterolemia, unspecified: E78.00

## 2021-12-21 HISTORY — PX: LIPOMA EXCISION: SHX5283

## 2021-12-21 HISTORY — DX: Type 2 diabetes mellitus without complications: E11.9

## 2021-12-21 HISTORY — DX: Essential (primary) hypertension: I10

## 2021-12-21 LAB — POCT I-STAT, CHEM 8
BUN: 16 mg/dL (ref 8–23)
Calcium, Ion: 1.29 mmol/L (ref 1.15–1.40)
Chloride: 106 mmol/L (ref 98–111)
Creatinine, Ser: 0.5 mg/dL (ref 0.44–1.00)
Glucose, Bld: 115 mg/dL — ABNORMAL HIGH (ref 70–99)
HCT: 41 % (ref 36.0–46.0)
Hemoglobin: 13.9 g/dL (ref 12.0–15.0)
Potassium: 3.4 mmol/L — ABNORMAL LOW (ref 3.5–5.1)
Sodium: 143 mmol/L (ref 135–145)
TCO2: 26 mmol/L (ref 22–32)

## 2021-12-21 LAB — GLUCOSE, CAPILLARY: Glucose-Capillary: 112 mg/dL — ABNORMAL HIGH (ref 70–99)

## 2021-12-21 SURGERY — EXCISION LIPOMA
Anesthesia: General | Site: Back | Laterality: Left

## 2021-12-21 MED ORDER — 0.9 % SODIUM CHLORIDE (POUR BTL) OPTIME
TOPICAL | Status: DC | PRN
  Administered 2021-12-21: 500 mL

## 2021-12-21 MED ORDER — FENTANYL CITRATE (PF) 100 MCG/2ML IJ SOLN
25.0000 ug | INTRAMUSCULAR | Status: DC | PRN
  Administered 2021-12-21 (×3): 25 ug via INTRAVENOUS

## 2021-12-21 MED ORDER — ACETAMINOPHEN 500 MG PO TABS
ORAL_TABLET | ORAL | Status: AC
Start: 2021-12-21 — End: ?
  Filled 2021-12-21: qty 2

## 2021-12-21 MED ORDER — ONDANSETRON HCL 4 MG/2ML IJ SOLN
INTRAMUSCULAR | Status: AC
  Filled 2021-12-21: qty 2

## 2021-12-21 MED ORDER — OXYCODONE-ACETAMINOPHEN 5-325 MG PO TABS: 1.0000 | ORAL_TABLET | ORAL | 0 refills | Status: AC | PRN

## 2021-12-21 MED ORDER — EPHEDRINE SULFATE-NACL 50-0.9 MG/10ML-% IV SOSY
PREFILLED_SYRINGE | INTRAVENOUS | Status: DC | PRN
  Administered 2021-12-21: 10 mg via INTRAVENOUS

## 2021-12-21 MED ORDER — DEXAMETHASONE SODIUM PHOSPHATE 10 MG/ML IJ SOLN
INTRAMUSCULAR | Status: AC
  Filled 2021-12-21: qty 1

## 2021-12-21 MED ORDER — CHLORHEXIDINE GLUCONATE CLOTH 2 % EX PADS: 6.0000 | MEDICATED_PAD | Freq: Once | CUTANEOUS | Status: DC

## 2021-12-21 MED ORDER — ACETAMINOPHEN 500 MG PO TABS
1000.0000 mg | ORAL_TABLET | Freq: Once | ORAL | Status: AC
  Administered 2021-12-21: 1000 mg via ORAL

## 2021-12-21 MED ORDER — ONDANSETRON HCL 4 MG/2ML IJ SOLN
INTRAMUSCULAR | Status: DC | PRN
  Administered 2021-12-21: 4 mg via INTRAVENOUS

## 2021-12-21 MED ORDER — CEFAZOLIN SODIUM-DEXTROSE 2-4 GM/100ML-% IV SOLN
INTRAVENOUS | Status: AC
Start: 2021-12-21 — End: ?
  Filled 2021-12-21: qty 100

## 2021-12-21 MED ORDER — LIDOCAINE 2% (20 MG/ML) 5 ML SYRINGE
INTRAMUSCULAR | Status: DC | PRN
  Administered 2021-12-21: 60 mg via INTRAVENOUS

## 2021-12-21 MED ORDER — PROPOFOL 10 MG/ML IV BOLUS
INTRAVENOUS | Status: DC | PRN
  Administered 2021-12-21: 130 mg via INTRAVENOUS

## 2021-12-21 MED ORDER — LACTATED RINGERS IV SOLN: INTRAVENOUS | Status: DC

## 2021-12-21 MED ORDER — BUPIVACAINE-EPINEPHRINE 0.25% -1:200000 IJ SOLN
INTRAMUSCULAR | Status: DC | PRN
  Administered 2021-12-21: 10 mL

## 2021-12-21 MED ORDER — CEFAZOLIN SODIUM-DEXTROSE 2-4 GM/100ML-% IV SOLN
2.0000 g | INTRAVENOUS | Status: AC
  Administered 2021-12-21: 2 g via INTRAVENOUS

## 2021-12-21 MED ORDER — EPHEDRINE 5 MG/ML INJ
INTRAVENOUS | Status: AC
  Filled 2021-12-21: qty 5

## 2021-12-21 MED ORDER — FENTANYL CITRATE (PF) 100 MCG/2ML IJ SOLN
INTRAMUSCULAR | Status: AC
  Filled 2021-12-21: qty 2

## 2021-12-21 MED ORDER — LIDOCAINE HCL (PF) 2 % IJ SOLN
INTRAMUSCULAR | Status: AC
  Filled 2021-12-21: qty 5

## 2021-12-21 MED ORDER — DEXAMETHASONE SODIUM PHOSPHATE 10 MG/ML IJ SOLN
INTRAMUSCULAR | Status: DC | PRN
  Administered 2021-12-21: 5 mg via INTRAVENOUS

## 2021-12-21 MED ORDER — FENTANYL CITRATE (PF) 100 MCG/2ML IJ SOLN
INTRAMUSCULAR | Status: DC | PRN
  Administered 2021-12-21 (×4): 50 ug via INTRAVENOUS

## 2021-12-21 MED ORDER — PROPOFOL 10 MG/ML IV BOLUS
INTRAVENOUS | Status: AC
  Filled 2021-12-21: qty 20

## 2021-12-21 SURGICAL SUPPLY — 41 items
ADH SKN CLS APL DERMABOND .7 (GAUZE/BANDAGES/DRESSINGS) ×1
APL PRP STRL LF DISP 70% ISPRP (MISCELLANEOUS) ×1
BLADE SURG 15 STRL LF DISP TIS (BLADE) ×1 IMPLANT
BLADE SURG 15 STRL SS (BLADE) ×2
CHLORAPREP W/TINT 26 (MISCELLANEOUS) ×1 IMPLANT
CONNECTOR 5 IN 1 STRAIGHT STRL (MISCELLANEOUS) ×1 IMPLANT
COVER BACK TABLE 60X90IN (DRAPES) ×2 IMPLANT
COVER MAYO STAND STRL (DRAPES) ×2 IMPLANT
DERMABOND ADVANCED (GAUZE/BANDAGES/DRESSINGS) ×1
DERMABOND ADVANCED .7 DNX12 (GAUZE/BANDAGES/DRESSINGS) IMPLANT
DRAPE LAPAROTOMY 100X72 PEDS (DRAPES) ×1 IMPLANT
DRAPE UTILITY XL STRL (DRAPES) ×2 IMPLANT
ELECT REM PT RETURN 9FT ADLT (ELECTROSURGICAL) ×2
ELECTRODE REM PT RTRN 9FT ADLT (ELECTROSURGICAL) ×1 IMPLANT
GAUZE SPONGE 4X4 12PLY STRL (GAUZE/BANDAGES/DRESSINGS) ×2 IMPLANT
GLOVE BIO SURGEON STRL SZ7.5 (GLOVE) ×1 IMPLANT
GLOVE BIOGEL PI IND STRL 7.0 (GLOVE) IMPLANT
GLOVE BIOGEL PI IND STRL 7.5 (GLOVE) IMPLANT
GLOVE BIOGEL PI IND STRL 8 (GLOVE) IMPLANT
GLOVE BIOGEL PI INDICATOR 7.0 (GLOVE) ×1
GLOVE BIOGEL PI INDICATOR 7.5 (GLOVE) ×1
GLOVE BIOGEL PI INDICATOR 8 (GLOVE) ×1
GLOVE INDICATOR 6.5 STRL GRN (GLOVE) ×2 IMPLANT
GLOVE SRG 8 PF TXTR STRL LF DI (GLOVE) ×1 IMPLANT
GLOVE SURG LTX SZ7 (GLOVE) ×1 IMPLANT
GLOVE SURG PR MICRO ENCORE 7.5 (GLOVE) ×2 IMPLANT
GLOVE SURG UNDER POLY LF SZ8 (GLOVE) ×2
GOWN STRL REUS W/TWL LRG LVL3 (GOWN DISPOSABLE) ×3 IMPLANT
KIT TURNOVER CYSTO (KITS) ×2 IMPLANT
MANIFOLD NEPTUNE II (INSTRUMENTS) ×1 IMPLANT
NEEDLE HYPO 22GX1.5 SAFETY (NEEDLE) ×1 IMPLANT
NS IRRIG 500ML POUR BTL (IV SOLUTION) ×1 IMPLANT
PACK BASIN DAY SURGERY FS (CUSTOM PROCEDURE TRAY) ×2 IMPLANT
PENCIL SMOKE EVACUATOR (MISCELLANEOUS) ×2 IMPLANT
SUT MNCRL AB 4-0 PS2 18 (SUTURE) ×2 IMPLANT
SUT VIC AB 3-0 SH 18 (SUTURE) ×2 IMPLANT
SYR BULB IRRIG 60ML STRL (SYRINGE) ×2 IMPLANT
SYR CONTROL 10ML LL (SYRINGE) ×2 IMPLANT
TOWEL OR 17X26 10 PK STRL BLUE (TOWEL DISPOSABLE) ×2 IMPLANT
TUBE CONNECTING 12X1/4 (SUCTIONS) ×2 IMPLANT
YANKAUER SUCT BULB TIP NO VENT (SUCTIONS) ×2 IMPLANT

## 2021-12-21 NOTE — Anesthesia Procedure Notes (Signed)
Procedure Name: LMA Insertion ?Date/Time: 12/21/2021 9:47 AM ?Performed by: Rogers Blocker, CRNA ?Pre-anesthesia Checklist: Patient identified, Emergency Drugs available, Suction available and Patient being monitored ?Patient Re-evaluated:Patient Re-evaluated prior to induction ?Oxygen Delivery Method: Circle System Utilized ?Preoxygenation: Pre-oxygenation with 100% oxygen ?Induction Type: IV induction ?Ventilation: Mask ventilation without difficulty ?LMA: LMA inserted ?LMA Size: 4.0 ?Number of attempts: 1 ?Placement Confirmation: positive ETCO2 ?Tube secured with: Tape ?Dental Injury: Teeth and Oropharynx as per pre-operative assessment  ? ? ? ? ?

## 2021-12-21 NOTE — Discharge Instructions (Signed)
?  Post Anesthesia Home Care Instructions ? ?Activity: ?Get plenty of rest for the remainder of the day. A responsible individual must stay with you for 24 hours following the procedure.  ?For the next 24 hours, DO NOT: ?-Drive a car ?-Paediatric nurse ?-Drink alcoholic beverages ?-Take any medication unless instructed by your physician ?-Make any legal decisions or sign important papers. ? ?Meals: ?Start with liquid foods such as gelatin or soup. Progress to regular foods as tolerated. Avoid greasy, spicy, heavy foods. If nausea and/or vomiting occur, drink only clear liquids until the nausea and/or vomiting subsides. Call your physician if vomiting continues. ? ?Special Instructions/Symptoms: ?Your throat may feel dry or sore from the anesthesia or the breathing tube placed in your throat during surgery. If this causes discomfort, gargle with warm salt water. The discomfort should disappear within 24 hours. ? ?   ?No acetaminophen/Tylenol until after 3:30 pm today if needed. ? ?

## 2021-12-21 NOTE — H&P (Signed)
? ?  Admitting Physician: Nickola Major Faizah Kandler ? ?Service: General surgery ? ?CC: Left mid back lipoma ? ?Subjective  ? ?HPI: ?Sheena Parks is an 76 y.o. female who is here for left mid back lipoma excision. ? ?Past Medical History:  ?Diagnosis Date  ? Diabetes mellitus without complication ([place])   ? Hypercholesterolemia   ? Hypertension   ? ? ?Past Surgical History:  ?Procedure Laterality Date  ? CARPAL TUNNEL RELEASE    ? 09/2021  ? ? ?Family History  ?Problem Relation Age of Onset  ? Breast cancer Neg Hx   ? ? ?Social:  reports that she has never smoked. She has never used smokeless tobacco. She reports current alcohol use. She reports that she does not use drugs. ? ?Allergies:  ?Allergies  ?Allergen Reactions  ? Morphine Hives  ? ? ?Medications: ?Current Outpatient Medications  ?Medication Instructions  ? amLODipine (NORVASC) 5 mg, Oral, Daily  ? benazepril-hydrochlorthiazide (LOTENSIN HCT) 20-25 MG tablet 1 tablet, Oral, Daily  ? BIOTIN PO Oral  ? fexofenadine-pseudoephedrine (ALLEGRA-D 24) 180-240 MG 24 hr tablet 1 tablet, Oral, Daily  ? ibuprofen (ADVIL) 200 mg, Oral, Every 6 hours PRN  ? metFORMIN (GLUCOPHAGE) 500 MG tablet Oral, 2 times daily with meals  ? omega-3 acid ethyl esters (LOVAZA) 1 g capsule Oral, 2 times daily  ? Pediatric Multiple Vitamins (CHILDRENS MULTIVITAMIN) chewable tablet 1 tablet, Oral, Daily  ? simvastatin (ZOCOR) 10 mg, Oral, Daily  ? VITAMIN D PO Oral  ? ? ?ROS - all of the below systems have been reviewed with the patient and positives are indicated with bold text ?General: chills, fever or night sweats ?Eyes: blurry vision or double vision ?ENT: epistaxis or sore throat ?Allergy/Immunology: itchy/watery eyes or nasal congestion ?Hematologic/Lymphatic: bleeding problems, blood clots or swollen lymph nodes ?Endocrine: temperature intolerance or unexpected weight changes ?Breast: new or changing breast lumps or nipple discharge ?Resp: cough, shortness of breath, or wheezing ?CV:  chest pain or dyspnea on exertion ?GI: as per HPI ?GU: dysuria, trouble voiding, or hematuria ?MSK: joint pain or joint stiffness ?Neuro: TIA or stroke symptoms ?Derm: pruritus and skin lesion changes ?Psych: anxiety and depression ? ?Objective  ? ?PE ?Blood pressure (!) 158/87, pulse 68, temperature 98.3 ?F (36.8 ?C), resp. rate 17, height '5\' 4"'$  (1.626 m), weight 86.7 kg, SpO2 97 %. ?Constitutional: NAD; conversant; no deformities ?Eyes: Moist conjunctiva; no lid lag; anicteric; PERRL ?Neck: Trachea midline; no thyromegaly ?Lungs: Normal respiratory effort; no tactile fremitus ?CV: RRR; no palpable thrills; no pitting edema ?GI: Abd Soft, nontender; no palpable hepatosplenomegaly ?MSK: Normal range of motion of extremities; no clubbing/cyanosis ?Psychiatric: Appropriate affect; alert and oriented x3 ?Lymphatic: No palpable cervical or axillary lymphadenopathy ? ?No results found for this or any previous visit (from the past 24 hour(s)). ? ?Imaging Orders  ?No imaging studies ordered today  ? ? ? ?Assessment and Plan  ? ?[name] is an 76 y.o. female with left mid back lipoma.  I recommended excision.  The procedure itself as well as its risks, benefits and alternatives were discussed and the patient granted consent to proceed.  We will proceed as scheduled. ? ?  ICD-10-CM   ?1. HYPERTENSION, BENIGN SYSTEMIC  I10 EKG 12 lead per protocol  ?  EKG 12 lead per protocol  ?  ? ? ? ?Felicie Morn, MD ? ?[facility] ?Use [email or web address] to contact on call provider ? ? ? ?

## 2021-12-21 NOTE — Anesthesia Preprocedure Evaluation (Addendum)
Anesthesia Evaluation  ?Patient identified by MRN, date of birth, ID band ?Patient awake ? ? ? ?Reviewed: ?Allergy & Precautions, H&P , NPO status , Patient's Chart, lab work & pertinent test results ? ?Airway ?Mallampati: II ? ?TM Distance: >3 FB ?Neck ROM: Full ? ? ? Dental ?no notable dental hx. ?(+) Teeth Intact, Dental Advisory Given ?  ?Pulmonary ?neg pulmonary ROS,  ?  ?Pulmonary exam normal ?breath sounds clear to auscultation ? ? ? ? ? ? Cardiovascular ?hypertension, Pt. on medications ? ?Rhythm:Regular Rate:Normal ? ? ?  ?Neuro/Psych ?negative neurological ROS ? negative psych ROS  ? GI/Hepatic ?negative GI ROS, Neg liver ROS,   ?Endo/Other  ?diabetes, Type 2, Oral Hypoglycemic Agents ? Renal/GU ?negative Renal ROS  ?negative genitourinary ?  ?Musculoskeletal ? ? Abdominal ?  ?Peds ? Hematology ? ?(+) Blood dyscrasia, anemia ,   ?Anesthesia Other Findings ? ? Reproductive/Obstetrics ?negative OB ROS ? ?  ? ? ? ? ? ? ? ? ? ? ? ? ? ?  ?  ? ? ? ? ? ? ? ?Anesthesia Physical ?Anesthesia Plan ? ?ASA: 2 ? ?Anesthesia Plan: General  ? ?Post-op Pain Management: Tylenol PO (pre-op)*  ? ?Induction: Intravenous ? ?PONV Risk Score and Plan: 4 or greater and Ondansetron and Dexamethasone ? ?Airway Management Planned: LMA ? ?Additional Equipment:  ? ?Intra-op Plan:  ? ?Post-operative Plan: Extubation in OR ? ?Informed Consent: I have reviewed the patients History and Physical, chart, labs and discussed the procedure including the risks, benefits and alternatives for the proposed anesthesia with the patient or authorized representative who has indicated his/her understanding and acceptance.  ? ? ? ?Dental advisory given ? ?Plan Discussed with: CRNA ? ?Anesthesia Plan Comments:   ? ? ? ? ? ? ?Anesthesia Quick Evaluation ? ?

## 2021-12-21 NOTE — Anesthesia Postprocedure Evaluation (Signed)
Anesthesia Post Note ? ?Patient: Sheena Parks ? ?Procedure(s) Performed: REMOVAL LEFT MID BACK LIPOMA (Left: Back) ? ?  ? ?Patient location during evaluation: PACU ?Anesthesia Type: General ?Level of consciousness: awake and alert ?Pain management: pain level controlled ?Vital Signs Assessment: post-procedure vital signs reviewed and stable ?Respiratory status: spontaneous breathing, nonlabored ventilation and respiratory function stable ?Cardiovascular status: blood pressure returned to baseline and stable ?Postop Assessment: no apparent nausea or vomiting ?Anesthetic complications: no ? ? ?No notable events documented. ? ?Last Vitals:  ?Vitals:  ? 12/21/21 1045 12/21/21 1100  ?BP: (!) 157/92 (!) 160/89  ?Pulse: 74 73  ?Resp: 12 (!) 23  ?Temp:    ?SpO2: 98% 94%  ?  ?Last Pain:  ?Vitals:  ? 12/21/21 1112  ?PainSc: 6   ? ? ?  ?  ?  ?  ?  ?  ? ?Lily Kernen,W. EDMOND ? ? ? ? ?

## 2021-12-21 NOTE — Op Note (Signed)
? ?  Patient: Sheena Parks (1946-05-11, [id number]) ? ?Date of Surgery: 12/21/2021  ? ?Preoperative Diagnosis: LEFT MID BACK LIPOMA  ? ?Postoperative Diagnosis: LEFT MID BACK LIPOMA  ? ?Surgical Procedure: REMOVAL LEFT MID BACK LIPOMA:   ? ?Operative Team Members:  ?Surgeon(s) and Role: ?   * Braelyn Jenson, Nickola Major, MD - Primary  ? ?Anesthesiologist: Roderic Palau, MD ?CRNA: Rogers Blocker, CRNA  ? ?Anesthesia: Choice  ? ?Fluids:  ?Total I/O ?In: 800 [I.V.:700; IV Piggyback:100] ?Out: -  ? ?Complications: None ? ?Drains:  none  ? ?Specimen:  ?ID Type Source Tests Collected by Time Destination  ?1 : left mid back lipoma Tissue PATH Other SURGICAL PATHOLOGY Zamiyah Resendes, Nickola Major, MD 12/21/2021 1009   ?  ? ?Disposition:  PACU - hemodynamically stable. ? ?Plan of Care: Discharge to home after PACU ? ? ? ?Indications for Procedure: SHUNDRA WIRSING is a 76 y.o. female who presented with a left mid back lipoma. ? ?The procedure itself as well as its risks, benefits and alternatives were discussed.  The risks discussed included but were not limited to the risk of infection, bleeding, damage to nearby structures, and wound healing issues.  After a full discussion and all questions answered the patient granted consent to proceed. ? ?Findings: left mid back lipoma 7cm tall x 8 cm wide x 3 cm deep ? ? ?Description of Procedure:  ? ?On the date stated above patient taken operating room suite.  General anesthesia was induced and the patient was placed in right lateral decubitus position.  The patient's back was prepped and draped in usual sterile fashion.  A timeout was completed verifying the correct patient, procedure, position, and equipment needed for the case.  ? ?I injected the area with local anesthetic and made incision over the lipoma.  I dissected the lipoma out from the surrounding tissues using electrocautery and blunt dissection.  Lipoma was removed from the field and passed off as a specimen.  It measured 8 cm  wide by 7 cm tall by 3 cm deep.  The wound was reapproximated using multiple Vicryl sutures in the deep layer.  Running 3-0 Vicryl suture in the deep dermal layer.  And running 4-0 Monocryl subcuticular.  Dermabond was applied.  All sponge and needle counts were correct at the end this case.  ? ? ? ?At the end of the case we reviewed the infection status of the case. ?Patient: Private Patient Elective Case ?Case: Elective ?Infection Present At Time Of Surgery (PATOS): None ? ?Louanna Raw, MD ?General, Bariatric, & Minimally Invasive Surgery ?[facility], [place] ? ?

## 2021-12-21 NOTE — Transfer of Care (Signed)
Immediate Anesthesia Transfer of Care Note ? ?Patient: Sheena Parks ? ?Procedure(s) Performed: REMOVAL LEFT MID BACK LIPOMA (Left: Back) ? ?Patient Location: PACU ? ?Anesthesia Type:General ? ?Level of Consciousness: drowsy, patient cooperative and responds to stimulation ? ?Airway & Oxygen Therapy: Patient Spontanous Breathing ? ?Post-op Assessment: Report given to RN and Post -op Vital signs reviewed and stable ? ?Post vital signs: Reviewed and stable ? ?Last Vitals:  ?Vitals Value Taken Time  ?BP 148/86 12/21/21 1030  ?Temp    ?Pulse    ?Resp 19 12/21/21 1030  ?SpO2    ?Vitals shown include unvalidated device data. ? ?Last Pain:  ?Vitals:  ? 12/21/21 0812  ?PainSc: 0-No pain  ?   ? ?Patients Stated Pain Goal: 4 (12/21/21 4114) ? ?Complications: No notable events documented. ?

## 2021-12-22 ENCOUNTER — Encounter (HOSPITAL_BASED_OUTPATIENT_CLINIC_OR_DEPARTMENT_OTHER): Payer: Self-pay | Admitting: Surgery

## 2021-12-22 LAB — SURGICAL PATHOLOGY

## 2022-02-02 ENCOUNTER — Other Ambulatory Visit: Payer: Self-pay | Admitting: Internal Medicine

## 2022-02-02 DIAGNOSIS — Z1231 Encounter for screening mammogram for malignant neoplasm of breast: Secondary | ICD-10-CM

## 2022-02-17 DIAGNOSIS — G5600 Carpal tunnel syndrome, unspecified upper limb: Secondary | ICD-10-CM | POA: Diagnosis not present

## 2022-02-17 DIAGNOSIS — E7800 Pure hypercholesterolemia, unspecified: Secondary | ICD-10-CM | POA: Diagnosis not present

## 2022-02-17 DIAGNOSIS — M1990 Unspecified osteoarthritis, unspecified site: Secondary | ICD-10-CM | POA: Diagnosis not present

## 2022-02-17 DIAGNOSIS — E1169 Type 2 diabetes mellitus with other specified complication: Secondary | ICD-10-CM | POA: Diagnosis not present

## 2022-02-17 DIAGNOSIS — I10 Essential (primary) hypertension: Secondary | ICD-10-CM | POA: Diagnosis not present

## 2022-02-17 DIAGNOSIS — M8580 Other specified disorders of bone density and structure, unspecified site: Secondary | ICD-10-CM | POA: Diagnosis not present

## 2022-02-18 DIAGNOSIS — M79642 Pain in left hand: Secondary | ICD-10-CM | POA: Diagnosis not present

## 2022-02-18 DIAGNOSIS — G5601 Carpal tunnel syndrome, right upper limb: Secondary | ICD-10-CM | POA: Diagnosis not present

## 2022-02-18 DIAGNOSIS — M79641 Pain in right hand: Secondary | ICD-10-CM | POA: Diagnosis not present

## 2022-04-13 ENCOUNTER — Ambulatory Visit
Admission: RE | Admit: 2022-04-13 | Discharge: 2022-04-13 | Disposition: A | Discharge status: Home / Self Care | Payer: Medicare HMO | Source: Ambulatory Visit | Attending: Internal Medicine | Admitting: Internal Medicine

## 2022-04-13 DIAGNOSIS — Z1231 Encounter for screening mammogram for malignant neoplasm of breast: Secondary | ICD-10-CM

## 2022-04-22 ENCOUNTER — Other Ambulatory Visit: Payer: Self-pay | Admitting: Hematology and Oncology

## 2022-04-22 ENCOUNTER — Other Ambulatory Visit: Payer: Self-pay | Admitting: Geriatric Medicine

## 2022-04-22 DIAGNOSIS — M85852 Other specified disorders of bone density and structure, left thigh: Secondary | ICD-10-CM

## 2022-04-22 DIAGNOSIS — Z171 Estrogen receptor negative status [ER-]: Secondary | ICD-10-CM

## 2022-05-06 ENCOUNTER — Ambulatory Visit
Admission: RE | Admit: 2022-05-06 | Discharge: 2022-05-06 | Disposition: A | Discharge status: Home / Self Care | Payer: Medicare HMO | Source: Ambulatory Visit | Attending: Hematology and Oncology | Admitting: Hematology and Oncology

## 2022-05-06 DIAGNOSIS — C50411 Malignant neoplasm of upper-outer quadrant of right female breast: Secondary | ICD-10-CM

## 2022-10-14 ENCOUNTER — Ambulatory Visit
Admission: RE | Admit: 2022-10-14 | Discharge: 2022-10-14 | Disposition: A | Discharge status: Home / Self Care | Payer: Medicare HMO | Source: Ambulatory Visit | Attending: Geriatric Medicine | Admitting: Geriatric Medicine

## 2022-10-14 DIAGNOSIS — M85852 Other specified disorders of bone density and structure, left thigh: Secondary | ICD-10-CM

## 2023-03-11 ENCOUNTER — Other Ambulatory Visit: Payer: Self-pay | Admitting: Geriatric Medicine

## 2023-03-11 DIAGNOSIS — Z1231 Encounter for screening mammogram for malignant neoplasm of breast: Secondary | ICD-10-CM

## 2023-04-20 ENCOUNTER — Ambulatory Visit
Admission: RE | Admit: 2023-04-20 | Discharge: 2023-04-20 | Disposition: A | Discharge status: Home / Self Care | Payer: Medicare PPO | Source: Ambulatory Visit | Attending: Geriatric Medicine | Admitting: Geriatric Medicine

## 2023-04-20 DIAGNOSIS — Z1231 Encounter for screening mammogram for malignant neoplasm of breast: Secondary | ICD-10-CM

## 2024-02-15 ENCOUNTER — Other Ambulatory Visit: Payer: Self-pay | Admitting: Family

## 2024-02-15 DIAGNOSIS — Z1231 Encounter for screening mammogram for malignant neoplasm of breast: Secondary | ICD-10-CM

## 2024-04-23 ENCOUNTER — Ambulatory Visit
Admission: RE | Admit: 2024-04-23 | Discharge: 2024-04-23 | Disposition: A | Discharge status: Home / Self Care | Source: Ambulatory Visit | Attending: Family | Admitting: Family

## 2024-04-23 DIAGNOSIS — Z1231 Encounter for screening mammogram for malignant neoplasm of breast: Secondary | ICD-10-CM
